# Patient Record
Sex: Female | Born: 2005 | Hispanic: Yes | Marital: Single | Smoking: Never smoker
Health system: Southern US, Community
[De-identification: ages and names within clinical notes are randomized; demographics above are authoritative.]

## PROBLEM LIST (undated history)

## (undated) DIAGNOSIS — J45909 Unspecified asthma, uncomplicated: Secondary | ICD-10-CM

---
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
## 2023-04-18 ENCOUNTER — Encounter (INDEPENDENT_AMBULATORY_CARE_PROVIDER_SITE_OTHER): Payer: Self-pay

## 2023-08-04 ENCOUNTER — Encounter (INDEPENDENT_AMBULATORY_CARE_PROVIDER_SITE_OTHER): Payer: Self-pay | Admitting: Pediatric Gastroenterology

## 2023-12-22 ENCOUNTER — Other Ambulatory Visit (INDEPENDENT_AMBULATORY_CARE_PROVIDER_SITE_OTHER): Payer: Self-pay | Admitting: Neurology

## 2023-12-23 ENCOUNTER — Telehealth (INDEPENDENT_AMBULATORY_CARE_PROVIDER_SITE_OTHER): Payer: Self-pay | Admitting: Pediatric Endocrinology

## 2023-12-25 ENCOUNTER — Encounter (INDEPENDENT_AMBULATORY_CARE_PROVIDER_SITE_OTHER): Payer: Self-pay | Admitting: Neurology

## 2023-12-25 ENCOUNTER — Other Ambulatory Visit (INDEPENDENT_AMBULATORY_CARE_PROVIDER_SITE_OTHER): Payer: Self-pay | Admitting: Neurology

## 2023-12-25 ENCOUNTER — Ambulatory Visit: Payer: MEDICAID | Admitting: Nurse Practitioner

## 2023-12-25 ENCOUNTER — Ambulatory Visit (INDEPENDENT_AMBULATORY_CARE_PROVIDER_SITE_OTHER): Payer: MEDICAID | Admitting: Neurology

## 2023-12-25 ENCOUNTER — Telehealth: Payer: Self-pay | Admitting: Family Medicine

## 2023-12-25 VITALS — BP 124/60 | HR 62 | Ht 65.2 in | Wt 271.6 lb

## 2023-12-25 VITALS — BP 117/75 | HR 74 | Temp 98.2°F | Ht 65.52 in | Wt 272.6 lb

## 2023-12-25 DIAGNOSIS — E161 Other hypoglycemia: Secondary | ICD-10-CM | POA: Diagnosis not present

## 2023-12-25 DIAGNOSIS — L83 Acanthosis nigricans: Secondary | ICD-10-CM

## 2023-12-25 DIAGNOSIS — E559 Vitamin D deficiency, unspecified: Secondary | ICD-10-CM

## 2023-12-25 DIAGNOSIS — F603 Borderline personality disorder: Secondary | ICD-10-CM

## 2023-12-25 DIAGNOSIS — G4700 Insomnia, unspecified: Secondary | ICD-10-CM

## 2023-12-25 DIAGNOSIS — R519 Headache, unspecified: Secondary | ICD-10-CM

## 2023-12-25 DIAGNOSIS — F411 Generalized anxiety disorder: Secondary | ICD-10-CM | POA: Diagnosis not present

## 2023-12-25 DIAGNOSIS — R5383 Other fatigue: Secondary | ICD-10-CM

## 2023-12-25 DIAGNOSIS — E282 Polycystic ovarian syndrome: Secondary | ICD-10-CM

## 2023-12-25 MED ORDER — HYDROXYZINE PAMOATE 25 MG PO CAPS: ORAL_CAPSULE | ORAL | 0 refills | Status: DC

## 2023-12-25 MED ORDER — TOPIRAMATE 100 MG PO TABS: 100.0000 mg | ORAL_TABLET | Freq: Two times a day (BID) | ORAL | 5 refills | Status: DC

## 2023-12-25 NOTE — Progress Notes (Signed)
 Patient: Martha [name] MRN: [id number] Sex: female DOB: 01-22-2006  Provider: Keturah Shavers, MD Location of Care: [facility]  Note type: Routine return visit  Referral Source: [facility], Alvino Chapel, FNP History from: patient, [facility] chart, and mom Chief Complaint: Headaches   History of Present Illness: Martha [name] is a 18 y.o. female is here for follow-up management of headache. She has been having episodes of migraine and tension type headaches over the past few years with normal brain MRI/MRV and normal ophthalmology exam. She has been on Topamax with low to moderate dose of 50 mg twice daily with fairly good headache control until a couple of months ago when she started having more frequent headaches and over the past 1 months she had at least 15-20 headaches and needed to take OTC medications at least 15 days. The headaches are usually moderate and may last for few hours and occasionally she may have sensitivity to light but she does not have any nausea or vomiting with the headaches. She is also having some difficulty sleeping at night and she has history of anxiety and depression for which she was seen by psychologist but she has not been seen by behavioral service recently since she said that she is doing better. She was also having some vitamin D deficiency and she was taking vitamin D supplements at some point but not right now. Overall she thinks that she is having significantly more headaches over the past 1 to 2 months although she is taking the Topamax regularly without any missing doses. She may have history of polycystic ovary disease  Review of Systems: Review of system as per HPI, otherwise negative.  Past Medical History:  Diagnosis Date   Asthma    Hypothyroid 09/17/2021   Hospitalizations: No., Head Injury: No., Nervous System Infections: No., Immunizations up to date: Yes.     Surgical History History reviewed. No pertinent surgical  history.  Family History family history includes Asthma in an other family member; Cancer in an other family member; Diabetes in her maternal grandmother and paternal grandfather; Diabetes type II in her mother; Heart disease in an other family member; Hyperlipidemia in an other family member; Hypertension in an other family member; Stroke in an other family member; Thyroid disease in her paternal aunt.   Social History Social History   Socioeconomic History   Marital status: Single    Spouse name: Not on file   Number of children: Not on file   Years of education: Not on file   Highest education level: Not on file  Occupational History   Not on file  Tobacco Use   Smoking status: Never    Passive exposure: Yes   Smokeless tobacco: Never   Tobacco comments:    dad smokes outside  Vaping Use   Vaping status: Never Used  Substance and Sexual Activity   Alcohol use: Never   Drug use: Never   Sexual activity: Never    Birth control/protection: Abstinence  Other Topics Concern   Not on file  Social History Narrative   12th grade [employer]    Lives with mom, dad, and inside dog Diva. Dogs and a cat outdoors   Social Drivers of Corporate investment banker Strain: Not on file  Food Insecurity: Not on file  Transportation Needs: Not on file  Physical Activity: Not on file  Stress: Not on file  Social Connections: Not on file     Allergies  Allergen Reactions   Beef-Derived Drug Products     Alpha Gal positive- (Beef, Pork and Lamb)    Physical Exam BP 124/60   Pulse 62   Ht 5' 5.2" (1.656 m)   Wt 271 lb 9.7 oz (123.2 kg)   BMI 44.93 kg/m  Gen: Awake, alert, not in distress Skin: No rash, No neurocutaneous stigmata. HEENT: Normocephalic, no dysmorphic features, no conjunctival injection, nares patent, mucous membranes moist, oropharynx clear. Neck: Supple, no meningismus. No focal tenderness. Resp: Clear to auscultation bilaterally CV: Regular rate,  normal S1/S2, no murmurs, no rubs Abd: BS present, abdomen soft, non-tender, non-distended. No hepatosplenomegaly or mass Ext: Warm and well-perfused. No deformities, no muscle wasting, ROM full.  Neurological Examination: MS: Awake, alert, interactive. Normal eye contact, answered the questions appropriately, speech was fluent,  Normal comprehension.  Attention and concentration were normal. Cranial Nerves: Pupils were equal and reactive to light ( 5-57mm);  normal fundoscopic exam with sharp discs, visual field full with confrontation test; EOM normal, no nystagmus; no ptsosis, no double vision, intact facial sensation, face symmetric with full strength of facial muscles, hearing intact to finger rub bilaterally, palate elevation is symmetric, tongue protrusion is symmetric with full movement to both sides.  Sternocleidomastoid and trapezius are with normal strength. Tone-Normal Strength-Normal strength in all muscle groups DTRs-  Biceps Triceps Brachioradialis Patellar Ankle  R 2+ 2+ 2+ 2+ 2+  L 2+ 2+ 2+ 2+ 2+   Plantar responses flexor bilaterally, no clonus noted Sensation: Intact to light touch, temperature, vibration, Romberg negative. Coordination: No dysmetria on FTN test. No difficulty with balance. Gait: Normal walk and run. Tandem gait was normal. Was able to perform toe walking and heel walking without difficulty.   Assessment and Plan 1. Frequent headaches   2. Vitamin D deficiency   3. Anxiety state    This is a 18 year old female with episodes of migraine and tension type headaches.  Some anxiety issues and history of vitamin D deficiency, currently on low to moderate dose of Topamax with some help although she started having more frequent headaches over the past couple of months.  She has no focal findings on her neurological examination. Based on her weight she is on fairly low-dose Topamax and since she is having frequent headaches, I would recommend to increase the dose  of Topamax to 100 mg twice daily and see how she does.  Hopefully as soon as she is doing better in terms of headache intensity and frequency, we will decrease the dose of Topamax. She needs to have more hydration with adequate sleep and limited screen time She needs to follow-up with behavioral service again to help with some of the anxiety issues that she may have She may take occasional Tylenol or ibuprofen for moderate to severe headache but no more than 2 times a week She needs to discuss with her PCP if she needs to restart taking vitamin D supplements or check vitamin D level and then decide regarding supplements She will make a headache diary and bring it on her next visit I would like to see her in 5 months for follow-up visit and based on her headache diary may adjust the dose of medication or switch to another medication.  Meds ordered this encounter  Medications   topiramate (TOPAMAX) 100 MG tablet    Sig: Take 1 tablet (100 mg total) by mouth 2 (two) times daily.    Dispense:  60 tablet    Refill:  5  No orders of the defined types were placed in this encounter.

## 2023-12-25 NOTE — Telephone Encounter (Signed)
 Copied from CRM [id number]. Topic: Clinical - Medication Question >> Dec 25, 2023  4:08 PM [place] B wrote: Reason for CRM: pt's mother called in states she thought Dr Adriana Simas was going to prescribe pt something for sleep. I don't show anything about this. Please cb

## 2023-12-25 NOTE — Patient Instructions (Signed)
 Since the headaches are frequent, I would recommend to increase the dose of Topamax to 100 mg tablet twice daily Continue with more hydration, adequate sleep and limited screen time Follow-up with behavioral service for anxiety issues Have regular exercise on a daily basis May take occasional Tylenol or ibuprofen for moderate to severe headache but no more than 2 times a week Return in 5 months for follow-up visit

## 2023-12-25 NOTE — Progress Notes (Unsigned)
 The patient comes in today for a wellness visit.    A review of their health history was completed.  A review of medications was also completed.  Any needed refills; Inhaler  Eating habits: Normal  Falls/  MVA accidents in past few months: No  Regular exercise: Walk and working out occasionally   Specialist pt sees on regular basis: Neurologist   Preventative health issues were discussed.   Additional concerns: Would like blood work

## 2023-12-25 NOTE — Telephone Encounter (Signed)
 Order verified in visit. Sent to [facility].

## 2023-12-25 NOTE — Progress Notes (Unsigned)
 Subjective:    Patient ID: Martha [name], female    DOB: 03/14/2006, 18 y.o.   MRN: [id number]  HPI Presents today after visiting her neurologist this morning for headaches, and is requesting lab work. Has been having migraines, and complains of insomnia. Reports that she stayed up for four days straight last week, which is a new occurrence. She only stays up for 24 hours at most when having trouble sleeping. Denies racing thoughts, fatigue, or increased energy when she is unable to sleep. Has been Vitamin D deficient in the past, and took supplements. Denies taking them recently. Denies any caffeine intake. Has tried Melatonin for sleep, but says it does not work consistently. Has followed with psychiatry in the past, and was diagnosed with anxiety, depression and BPD. Reports that they put her on multiple medications that did not improve symptoms, and she went to therapy for 2 years and had better results with that, so she stopped seeing psychiatry. Denies any stress or feelings of anxiety. 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 No relationship or family issues noted.    Review of Systems  Constitutional:  Negative for activity change, appetite change and fatigue.  Respiratory:  Negative for choking, chest tightness, shortness of breath and wheezing.   Cardiovascular:  Negative for chest pain.  Neurological:  Positive for headaches.  Psychiatric/Behavioral:  Positive for sleep disturbance. Negative for agitation. The patient is not nervous/anxious and is not hyperactive.       Objective:   Physical Exam Vitals and nursing note reviewed.  Cardiovascular:     Rate and Rhythm: Normal rate and regular rhythm.     Heart sounds: Normal heart sounds, S1 normal and S2 normal. No murmur heard. Pulmonary:     Effort: Pulmonary effort is normal. No respiratory distress.     Breath sounds: Normal breath sounds. No wheezing.  Psychiatric:        Attention and  Perception: Attention normal.        Mood and Affect: Mood normal.        Speech: Speech normal.        Behavior: Behavior is not withdrawn.        Thought Content: Thought content normal.        Cognition and Memory: Cognition normal.        Judgment: Judgment normal.    Vitals:   12/25/23 1507  BP: 117/75  Pulse: 74  Temp: 98.2 F (36.8 C)  Height: 5' 5.52" (1.664 m)  Weight: 272 lb 9.6 oz (123.7 kg)  SpO2: 99%  TempSrc: Temporal  BMI (Calculated): 44.66       12/25/2023    3:09 PM 07/09/2022    4:07 PM 10/04/2021    1:56 PM 09/05/2021    4:11 PM 09/05/2021    2:54 PM  Depression screen PHQ 2/9  Decreased Interest 0 0 0 0 0  Down, Depressed, Hopeless 0 0 0 0 0  PHQ - 2 Score 0 0 0 0 0  Altered sleeping 3  1 0   Tired, decreased energy 0  1 3   Change in appetite 0  1 3   Feeling bad or failure about yourself  0  0 0   Trouble concentrating 0  0 0   Moving slowly or fidgety/restless 0  0 0   Suicidal thoughts 0      PHQ-9 Score 3  3 6    Difficult doing work/chores  Somewhat difficult           12/25/2023    3:09 PM 10/04/2021    1:55 PM 09/05/2021    4:12 PM 04/12/2021    3:20 PM  GAD 7 : Generalized Anxiety Score  Nervous, Anxious, on Edge 0 0 0 0  Control/stop worrying 0 0 0 1  Worry too much - different things 0 0 0 1  Trouble relaxing 0 0 0 0  Restless 0 1 0 0  Easily annoyed or irritable 0 1 0 1  Afraid - awful might happen 0 0 0 0  Total GAD 7 Score 0 2 0 3  Anxiety Difficulty  Not difficult at all Not difficult at all Not difficult at all      Assessment & Plan:  1. Hyperinsulinemia - Comprehensive metabolic panel - Hemoglobin A1c - Lipid panel  2. Acanthosis nigricans - Comprehensive metabolic panel - Hemoglobin A1c - Lipid panel  3. Morbid obesity (HCC) - Comprehensive metabolic panel - Hemoglobin A1c - Lipid panel  4. Vitamin D deficiency - VITAMIN D 25 Hydroxy (Vit-D Deficiency, Fractures)  5. PCOS (polycystic ovarian syndrome) -  Comprehensive metabolic panel - Hemoglobin A1c - Lipid panel  6. Fatigue, unspecified type - CBC with Differential/Platelet - Comprehensive metabolic panel  7. Insomnia, unspecified type (Primary)  8. Borderline personality disorder in adult [facility]) - Ambulatory referral to Psychiatry - Ambulatory referral to Psychology   Has been seen by both pediatric neurology and pediatric endocrinology within the last year. Talked with patient about options, and she agreed that she would like to get established with an adult psychiatrist. With the incidence of lack of sleep for four days and that she has had a previous diagnosis of borderline personality disorder in the past, I would like for her to be evaluated by psychiatry. Ordered medication to help with insomnia. Will order labs that have not been ordered in the last year.   Meds ordered this encounter  Medications   hydrOXYzine (VISTARIL) 25 MG capsule    Sig: Take one cap po at bedtime prn sleep.    Dispense:  30 capsule    Refill:  0    Supervising Provider:   Lilyan Punt A [[id number]]    Return if symptoms worsen or fail to improve.

## 2023-12-26 ENCOUNTER — Other Ambulatory Visit: Payer: Self-pay | Admitting: Nurse Practitioner

## 2023-12-26 ENCOUNTER — Encounter: Payer: Self-pay | Admitting: Nurse Practitioner

## 2023-12-26 LAB — COMPREHENSIVE METABOLIC PANEL
ALT: 14 IU/L (ref 0–32)
AST: 13 IU/L (ref 0–40)
Albumin: 4.5 g/dL (ref 4.0–5.0)
Alkaline Phosphatase: 95 IU/L (ref 42–106)
BUN/Creatinine Ratio: 18 (ref 9–23)
BUN: 15 mg/dL (ref 6–20)
Bilirubin Total: 0.2 mg/dL (ref 0.0–1.2)
CO2: 22 mmol/L (ref 20–29)
Calcium: 9.5 mg/dL (ref 8.7–10.2)
Chloride: 106 mmol/L (ref 96–106)
Creatinine, Ser: 0.84 mg/dL (ref 0.57–1.00)
Globulin, Total: 2.6 g/dL (ref 1.5–4.5)
Glucose: 85 mg/dL (ref 70–99)
Potassium: 4.5 mmol/L (ref 3.5–5.2)
Sodium: 142 mmol/L (ref 134–144)
Total Protein: 7.1 g/dL (ref 6.0–8.5)
eGFR: 103 mL/min/{1.73_m2} (ref >59)

## 2023-12-26 LAB — CBC WITH DIFFERENTIAL/PLATELET
Basophils Absolute: 0.1 10*3/uL (ref 0.0–0.2)
Basos: 1 %
EOS (ABSOLUTE): 0.2 10*3/uL (ref 0.0–0.4)
Eos: 2 %
Hematocrit: 40.6 % (ref 34.0–46.6)
Hemoglobin: 13.3 g/dL (ref 11.1–15.9)
Immature Grans (Abs): 0 10*3/uL (ref 0.0–0.1)
Immature Granulocytes: 0 %
Lymphocytes Absolute: 4.3 10*3/uL — ABNORMAL HIGH (ref 0.7–3.1)
Lymphs: 39 %
MCH: 28.7 pg (ref 26.6–33.0)
MCHC: 32.8 g/dL (ref 31.5–35.7)
MCV: 88 fL (ref 79–97)
Monocytes Absolute: 0.7 10*3/uL (ref 0.1–0.9)
Monocytes: 6 %
Neutrophils Absolute: 5.7 10*3/uL (ref 1.4–7.0)
Neutrophils: 52 %
Platelets: 302 10*3/uL (ref 150–450)
RBC: 4.64 x10E6/uL (ref 3.77–5.28)
RDW: 13.2 % (ref 11.7–15.4)
WBC: 11.1 10*3/uL — ABNORMAL HIGH (ref 3.4–10.8)

## 2023-12-26 LAB — LIPID PANEL
Chol/HDL Ratio: 2.6 ratio (ref 0.0–4.4)
Cholesterol, Total: 141 mg/dL (ref 100–169)
HDL: 55 mg/dL (ref >39)
LDL Chol Calc (NIH): 71 mg/dL (ref 0–109)
Triglycerides: 79 mg/dL (ref 0–89)
VLDL Cholesterol Cal: 15 mg/dL (ref 5–40)

## 2023-12-26 LAB — VITAMIN D 25 HYDROXY (VIT D DEFICIENCY, FRACTURES): Vit D, 25-Hydroxy: 13.4 ng/mL — ABNORMAL LOW (ref 30.0–100.0)

## 2023-12-26 LAB — HEMOGLOBIN A1C
Est. average glucose Bld gHb Est-mCnc: 103 mg/dL
Hgb A1c MFr Bld: 5.2 % (ref 4.8–5.6)

## 2023-12-26 MED ORDER — VITAMIN D (ERGOCALCIFEROL) 1.25 MG (50000 UNIT) PO CAPS: 50000.0000 [IU] | ORAL_CAPSULE | ORAL | 0 refills | Status: DC

## 2024-01-12 ENCOUNTER — Ambulatory Visit (INDEPENDENT_AMBULATORY_CARE_PROVIDER_SITE_OTHER): Payer: MEDICAID | Admitting: Nurse Practitioner

## 2024-01-12 ENCOUNTER — Encounter: Payer: Self-pay | Admitting: Nurse Practitioner

## 2024-01-12 VITALS — BP 104/75 | HR 75 | Temp 98.4°F | Ht 65.52 in | Wt 269.0 lb

## 2024-01-12 DIAGNOSIS — N939 Abnormal uterine and vaginal bleeding, unspecified: Secondary | ICD-10-CM

## 2024-01-12 DIAGNOSIS — N946 Dysmenorrhea, unspecified: Secondary | ICD-10-CM | POA: Insufficient documentation

## 2024-01-12 DIAGNOSIS — N921 Excessive and frequent menstruation with irregular cycle: Secondary | ICD-10-CM | POA: Diagnosis not present

## 2024-01-12 MED ORDER — DROSPIRENONE-ETHINYL ESTRADIOL 3-0.02 MG PO TABS: 1.0000 | ORAL_TABLET | Freq: Every day | ORAL | 0 refills | Status: DC

## 2024-01-12 MED ORDER — ALBUTEROL SULFATE HFA 108 (90 BASE) MCG/ACT IN AERS: 2.0000 | INHALATION_SPRAY | Freq: Four times a day (QID) | RESPIRATORY_TRACT | 0 refills | Status: DC | PRN

## 2024-01-12 NOTE — Progress Notes (Signed)
   Subjective:    Patient ID: Martha [name], female    DOB: [birth year], 18 y.o.   MRN: [id number]  HPI Irregular periods and heavy bleeding lasting 7 days Heavy bleeding 6 of 7 days using 5 to 6 pads during the day  And getting up 3 times a night for bleeding  Having significant cramping; for cramps takes tylenol and motrin  Lightheadedness during these cycles Has been on Yaz in the past which she tolerated well. Denies any history of sexual activity.  No her mother is present during the visit per patient request, defers being interviewed alone. PMH includes menorrhagia with irregular cycle and PCOS.  Review of Systems  Respiratory:  Negative for cough, chest tightness, shortness of breath and wheezing.   Genitourinary:  Positive for menstrual problem, pelvic pain and vaginal bleeding.   Social History   Tobacco Use   Smoking status: Never    Passive exposure: Yes   Smokeless tobacco: Never   Tobacco comments:    dad smokes outside  Vaping Use   Vaping status: Never Used  Substance Use Topics   Alcohol use: Never   Drug use: Never        Objective:   Physical Exam NAD.  Alert, oriented.  Calm cheerful affect.  Lungs clear.  Heart regular rate rhythm. Today's Vitals   01/12/24 1438  BP: 104/75  Pulse: 75  Temp: 98.4 F (36.9 C)  SpO2: 99%  Weight: 269 lb (122 kg)  Height: 5' 5.52" (1.664 m)   Body mass index is 44.06 kg/m.  12/25/2023 CBC hemoglobin 13.3.      Assessment & Plan:   Problem List Items Addressed This Visit       Genitourinary   Abnormal uterine bleeding - Primary   Dysmenorrhea     Other   Menorrhagia with irregular cycle   Meds ordered this encounter  Medications   drospirenone-ethinyl estradiol (YAZ) 3-0.02 MG tablet    Sig: Take 1 tablet by mouth daily.    Dispense:  84 tablet    Refill:  0    Supervising Provider:   Lilyan Punt A [[id number]]   albuterol (VENTOLIN HFA) 108 (90 Base) MCG/ACT inhaler    Sig: Inhale 2 puffs into  the lungs every 6 (six) hours as needed for wheezing or shortness of breath.    Dispense:  8 g    Refill:  0    Supervising Provider:   Lilyan Punt A [[id number]]   Restart Yaz as directed.  Our goal is to get shorter regular cycles with lighter flow and less cramping.  Patient to call back if no improvement over the next few weeks. At the end of the visit patient also requested refill on her albuterol inhaler to have on hand during the spring season just in case she needs it.  Advised patient if she is using it more than 3 times per week to restart her Flovent inhaler as well.  Verbalizes understanding.

## 2024-04-27 ENCOUNTER — Other Ambulatory Visit: Payer: Self-pay | Admitting: Nurse Practitioner

## 2024-05-26 ENCOUNTER — Encounter (INDEPENDENT_AMBULATORY_CARE_PROVIDER_SITE_OTHER): Payer: Self-pay | Admitting: Neurology

## 2024-05-26 ENCOUNTER — Ambulatory Visit (INDEPENDENT_AMBULATORY_CARE_PROVIDER_SITE_OTHER): Payer: MEDICAID | Admitting: Neurology

## 2024-05-26 VITALS — BP 126/72 | HR 74 | Ht 64.8 in | Wt 254.9 lb

## 2024-05-26 DIAGNOSIS — F411 Generalized anxiety disorder: Secondary | ICD-10-CM

## 2024-05-26 DIAGNOSIS — R519 Headache, unspecified: Secondary | ICD-10-CM | POA: Diagnosis not present

## 2024-05-26 DIAGNOSIS — E559 Vitamin D deficiency, unspecified: Secondary | ICD-10-CM

## 2024-05-26 MED ORDER — TOPIRAMATE 100 MG PO TABS: 100.0000 mg | ORAL_TABLET | Freq: Two times a day (BID) | ORAL | 7 refills | Status: AC

## 2024-05-26 NOTE — Progress Notes (Signed)
 Patient: Martha [name] MRN: [id number] Sex: female DOB: 05/09/2006  Provider: Norwood Abu, MD Location of Care: [facility]  Note type: Routine return visit  Referral Source: Bluford Jacqulyn MATSU, DO History from: patient, [facility] chart, and Mom Chief Complaint: Headaches   History of Present Illness: Martha [name] is a 18 y.o. female is here for follow-up management of headache. She has been having episodes of migraine and tension type headaches for the past few years with normal brain MRI/MRV as well as normal ophthalmology exam She was started on Topamax  for headache with some help but on her last visit in March since she was still having frequent headaches, the dose of medication increased to Topamax  100 mg twice daily which she has been taking over the past few months.  She has not had any side effects of medication. As per patient and her mother, she has had significant improvement of the headaches and over the past month she had just 1 headache needed OTC medications.  She usually sleeps well without any difficulty and with no awakening headaches.  She has no behavioral or mood changes.  Overall she is happy with her progress and has no other complaints or concerns at this time.  Review of Systems: Review of system as per HPI, otherwise negative.  Past Medical History:  Diagnosis Date   Asthma    Hypothyroid 09/17/2021   Hospitalizations: No., Head Injury: No., Nervous System Infections: No., Immunizations up to date: Yes.     Surgical History History reviewed. No pertinent surgical history.  Family History family history includes Asthma in an other family member; Cancer in an other family member; Diabetes in her maternal grandmother and paternal grandfather; Diabetes type II in her mother; Heart disease in an other family member; Hyperlipidemia in an other family member; Hypertension in an other family member; Stroke in an other family member; Thyroid  disease in  her paternal aunt.   Social History Social History   Socioeconomic History   Marital status: Single    Spouse name: Not on file   Number of children: Not on file   Years of education: Not on file   Highest education level: Not on file  Occupational History   Not on file  Tobacco Use   Smoking status: Never    Passive exposure: Yes   Smokeless tobacco: Never   Tobacco comments:    dad smokes outside  Vaping Use   Vaping status: Never Used  Substance and Sexual Activity   Alcohol use: Never   Drug use: Never   Sexual activity: Never    Birth control/protection: Abstinence  Other Topics Concern   Not on file  Social History Narrative   12th grade [employer]    Lives with mom, dad, and inside dog Diva. Dogs and a cat outdoors   Social Drivers of Corporate investment banker Strain: Not on file  Food Insecurity: Not on file  Transportation Needs: Not on file  Physical Activity: Not on file  Stress: Not on file  Social Connections: Not on file     Allergies  Allergen Reactions   Beef-Derived Drug Products     Alpha Gal positive- (Beef, Pork and Lamb)    Physical Exam BP 126/72   Pulse 74   Ht 5' 4.8 (1.646 m)   Wt 254 lb 13.6 oz (115.6 kg)   LMP 05/11/2024 (Exact Date)   BMI 42.67 kg/m  Gen: Awake, alert, not in distress  Skin: No rash, No neurocutaneous stigmata. HEENT: Normocephalic, no dysmorphic features, no conjunctival injection, nares patent, mucous membranes moist, oropharynx clear. Neck: Supple, no meningismus. No focal tenderness. Resp: Clear to auscultation bilaterally CV: Regular rate, normal S1/S2, no murmurs, no rubs Abd: BS present, abdomen soft, non-tender, non-distended. No hepatosplenomegaly or mass Ext: Warm and well-perfused. No deformities, no muscle wasting, ROM full.  Neurological Examination: MS: Awake, alert, interactive. Normal eye contact, answered the questions appropriately, speech was fluent,  Normal comprehension.   Attention and concentration were normal. Cranial Nerves: Pupils were equal and reactive to light ( 5-34mm);  normal fundoscopic exam with sharp discs, visual field full with confrontation test; EOM normal, no nystagmus; no ptsosis, no double vision, intact facial sensation, face symmetric with full strength of facial muscles, hearing intact to finger rub bilaterally, palate elevation is symmetric, tongue protrusion is symmetric with full movement to both sides.  Sternocleidomastoid and trapezius are with normal strength. Tone-Normal Strength-Normal strength in all muscle groups DTRs-  Biceps Triceps Brachioradialis Patellar Ankle  R 2+ 2+ 2+ 2+ 2+  L 2+ 2+ 2+ 2+ 2+   Plantar responses flexor bilaterally, no clonus noted Sensation: Intact to light touch, temperature, vibration, Romberg negative. Coordination: No dysmetria on FTN test. No difficulty with balance. Gait: Normal walk and run. Tandem gait was normal. Was able to perform toe walking and heel walking without difficulty.   Assessment and Plan 1. Frequent headaches   2. Vitamin D  deficiency   3. Anxiety state     This is a 18 year old female with episodes of migraine and tension type headaches, started on Topamax  with significant improvement of her headaches after increasing the dose of medication to the current dose of 100 mg twice daily.  She has no focal findings on her neurological examination and tolerating medication well with no side effects. Since she is not having any headaches over the past couple of months, I would recommend to slightly decrease the morning dose of Topamax  to 50 mg or half a tablet every morning and continue with the same 100 mg Topamax  every night and see how she does.  If she develops more frequent headaches then she will go back to the same previous dose of 100 mg twice daily Topamax . She will continue with more hydration, adequate sleep and limited screen time She may take occasional Tylenol  or ibuprofen   for moderate to severe headache If she develops more frequent headaches, mother will call my office and let me know She will continue making headache diary and bring it on her next visit I would like to see her in 6 months for follow-up visit to adjust the dose of medication if needed.  She and her mother understood and agreed with the plan.   Meds ordered this encounter  Medications   topiramate  (TOPAMAX ) 100 MG tablet    Sig: Take 1 tablet (100 mg total) by mouth 2 (two) times daily.    Dispense:  60 tablet    Refill:  7   No orders of the defined types were placed in this encounter.

## 2024-05-26 NOTE — Patient Instructions (Addendum)
 Since the headaches are significantly better, I would recommend to start taking slightly less medicine in the morning so he will take 50 mg or half a tablet in a.m. and 100 mg in p.m. If you start having more frequent headaches then you may go back to the previous dose of Topamax  which is 100 mg twice daily. Continue with more hydration, adequate sleep and limited screen time May take occasional Tylenol  or ibuprofen  for moderate to severe headache Return in 6 months for follow-up visit

## 2024-06-25 ENCOUNTER — Encounter: Payer: Self-pay | Admitting: Nurse Practitioner

## 2024-06-25 ENCOUNTER — Ambulatory Visit (INDEPENDENT_AMBULATORY_CARE_PROVIDER_SITE_OTHER): Payer: MEDICAID | Admitting: Nurse Practitioner

## 2024-06-25 VITALS — BP 112/75 | HR 69 | Temp 99.5°F | Ht 64.81 in | Wt 250.1 lb

## 2024-06-25 DIAGNOSIS — F419 Anxiety disorder, unspecified: Secondary | ICD-10-CM

## 2024-06-25 DIAGNOSIS — J4520 Mild intermittent asthma, uncomplicated: Secondary | ICD-10-CM | POA: Diagnosis not present

## 2024-06-25 DIAGNOSIS — Z23 Encounter for immunization: Secondary | ICD-10-CM

## 2024-06-25 DIAGNOSIS — G479 Sleep disorder, unspecified: Secondary | ICD-10-CM

## 2024-06-25 MED ORDER — HYDROXYZINE PAMOATE 25 MG PO CAPS: ORAL_CAPSULE | ORAL | 1 refills | Status: AC

## 2024-06-25 MED ORDER — ALBUTEROL SULFATE HFA 108 (90 BASE) MCG/ACT IN AERS: 2.0000 | INHALATION_SPRAY | Freq: Four times a day (QID) | RESPIRATORY_TRACT | 0 refills | Status: AC | PRN

## 2024-06-27 ENCOUNTER — Encounter: Payer: Self-pay | Admitting: Nurse Practitioner

## 2024-06-27 DIAGNOSIS — F419 Anxiety disorder, unspecified: Secondary | ICD-10-CM | POA: Insufficient documentation

## 2024-06-27 DIAGNOSIS — J4520 Mild intermittent asthma, uncomplicated: Secondary | ICD-10-CM | POA: Insufficient documentation

## 2024-06-27 NOTE — Progress Notes (Signed)
 Subjective:    Patient ID: Martha [name], female    DOB: [birth year], 18 y.o.   MRN: [id number]  HPI Discussed the use of AI scribe software for clinical note transcription with the patient, who gave verbal consent to proceed.  History of Present Illness Martha [name] is a 18 year old female who presents with insomnia and nightmares.  She experiences difficulty falling asleep and staying asleep, accompanied by nightmares. Upon waking, she has shortness of breath and sweating, which further disrupts her ability to return to sleep. This pattern has been ongoing since her last visit, with some nights resulting in no sleep at all. Previously, she was prescribed hydroxyzine , which improved her sleep for a couple of months. She discontinued it after the prescription ran out and managed well until recently. During the period she took hydroxyzine , she was able to sleep well through the night.  She has a history of frequent headaches for which she is under the care of a neurologist. She is currently taking topiramate  as prescribed by her neurologist.  She is actively managing her weight and has successfully lost 22 pounds, reducing from 270 to 250 pounds. She maintains an active lifestyle and reports a good appetite and diet.  She has a history of abnormal uterine bleeding, which has been managed with daily medication, resulting in improvement.  She uses albuterol  approximately twice a week, particularly with increased physical activity, and is also on a daily regimen of Flovent , two puffs in the morning and two at night. She reports occasional wheezing with activity but no chest pain or significant shortness of breath.  She wants to see a mental health therapist for anxiety management. She is currently in school. Denies suicidal or homicidal thoughts or ideation. Denies self harm behaviors .  Review of Systems  HENT:  Negative for sore throat and trouble swallowing.   Respiratory:  Negative  for cough, chest tightness, shortness of breath and wheezing.        Occasional mild wheezing with intense activity. Uses albuterol  rarely, around twice a week.   Cardiovascular:  Negative for chest pain.  Psychiatric/Behavioral:  Positive for sleep disturbance. Negative for self-injury and suicidal ideas. The patient is nervous/anxious.       06/25/2024    3:18 PM  Depression screen PHQ 2/9  Decreased Interest 0  Down, Depressed, Hopeless 0  PHQ - 2 Score 0  Altered sleeping 0  Tired, decreased energy 3  Change in appetite 0  Feeling bad or failure about yourself  0  Trouble concentrating 0  Moving slowly or fidgety/restless 0  Suicidal thoughts 0  PHQ-9 Score 3  Difficult doing work/chores Very difficult      06/25/2024    3:19 PM 01/12/2024    2:44 PM 12/25/2023    3:09 PM 10/04/2021    1:55 PM  GAD 7 : Generalized Anxiety Score  Nervous, Anxious, on Edge 2 0 0 0  Control/stop worrying 2 0 0 0  Worry too much - different things 2 0 0 0  Trouble relaxing 2 0 0 0  Restless 0 0 0 1  Easily annoyed or irritable 2 0 0 1  Afraid - awful might happen 0 0 0 0  Total GAD 7 Score 10 0 0 2  Anxiety Difficulty Somewhat difficult Not difficult at all  Not difficult at all        Objective:   Physical Exam Constitutional:      General: She is not  in acute distress. Neck:     Comments: Thyroid  non tender. No mass or goiter noted.  Cardiovascular:     Rate and Rhythm: Normal rate and regular rhythm.     Heart sounds: Normal heart sounds.  Pulmonary:     Effort: Pulmonary effort is normal.     Breath sounds: Normal breath sounds.  Musculoskeletal:     Cervical back: Neck supple.  Neurological:     Mental Status: She is alert and oriented to person, place, and time.  Psychiatric:        Mood and Affect: Mood normal.        Behavior: Behavior normal.        Thought Content: Thought content normal.        Judgment: Judgment normal.     Comments: Making good eye contact.  Speech clear. Calm, cheerful affect.     Today's Vitals   06/25/24 1513  BP: 112/75  Pulse: 69  Temp: 99.5 F (37.5 C)  SpO2: 99%  Weight: 250 lb 1 oz (113.4 kg)  Height: 5' 4.81 (1.646 m)  PainSc: 0-No pain   Body mass index is 41.86 kg/m.        Assessment & Plan:  1. Sleep disturbance (Primary) Chronic insomnia with nightmares, shortness of breath, and sweating. Hydroxyzine  previously effective. Symptoms recurred post-discontinuation. Hydroxyzine  preferred; trazodone considered if ineffective. - Prescribe hydroxyzine  25 mg for insomnia. - Discuss trazodone if hydroxyzine  ineffective after two weeks. - Set up MyChart account for communication and health management. - hydrOXYzine  (VISTARIL ) 25 MG capsule; Take one cap po at bedtime prn sleep.  Dispense: 90 capsule; Refill: 1  2. Need for vaccination  - Flu Vaccine QUAD 48mo+IM (Fluarix, Fluzone & Alfiuria Quad PF)  3. Anxiety Anxiety symptoms present. Therapy preferred initially; medication considered if therapy insufficient. - Provide referral to mental health therapist accepting Medicaid. - Discuss medication if therapy insufficient, based on therapist's recommendation. - hydrOXYzine  (VISTARIL ) 25 MG capsule; Take one cap po at bedtime prn sleep.  Dispense: 90 capsule; Refill: 1  4. Asthma Asthma well-controlled with current inhalers. Occasional exercise-induced wheezing and tightness. Consider Airsupra if endurance does not improve or albuterol  use increases. - Continue Flovent : two puffs morning and night. - Refill albuterol  inhaler for use as needed, up to twice a week. - Discuss Airsupra if endurance does not improve or albuterol  use increases.  Return if symptoms worsen or fail to improve.

## 2024-07-14 ENCOUNTER — Encounter (INDEPENDENT_AMBULATORY_CARE_PROVIDER_SITE_OTHER): Payer: Self-pay

## 2024-07-15 ENCOUNTER — Encounter (INDEPENDENT_AMBULATORY_CARE_PROVIDER_SITE_OTHER): Payer: Self-pay

## 2024-09-19 ENCOUNTER — Emergency Department (HOSPITAL_COMMUNITY)
Admission: EM | Admit: 2024-09-19 | Discharge: 2024-09-20 | Disposition: A | Payer: MEDICAID | Attending: Emergency Medicine | Admitting: Emergency Medicine

## 2024-09-19 ENCOUNTER — Other Ambulatory Visit: Payer: Self-pay

## 2024-09-19 DIAGNOSIS — R1030 Lower abdominal pain, unspecified: Secondary | ICD-10-CM

## 2024-09-19 DIAGNOSIS — N390 Urinary tract infection, site not specified: Secondary | ICD-10-CM

## 2024-09-19 NOTE — ED Triage Notes (Addendum)
 Pt here with c/o extreme lower abdominal cramping rated 10/10 for the past couple of days. Denies any other associated symptoms.

## 2024-09-20 ENCOUNTER — Emergency Department (HOSPITAL_COMMUNITY): Payer: MEDICAID

## 2024-09-20 ENCOUNTER — Encounter (HOSPITAL_COMMUNITY): Payer: Self-pay | Admitting: Emergency Medicine

## 2024-09-20 LAB — CBC WITH DIFFERENTIAL/PLATELET
Abs Immature Granulocytes: 0.02 K/uL (ref 0.00–0.07)
Basophils Absolute: 0.1 K/uL (ref 0.0–0.1)
Basophils Relative: 1 %
Eosinophils Absolute: 0.1 K/uL (ref 0.0–0.5)
Eosinophils Relative: 2 %
HCT: 41.2 % (ref 36.0–46.0)
Hemoglobin: 13.5 g/dL (ref 12.0–15.0)
Immature Granulocytes: 0 %
Lymphocytes Relative: 41 %
Lymphs Abs: 3.6 K/uL (ref 0.7–4.0)
MCH: 29.4 pg (ref 26.0–34.0)
MCHC: 32.8 g/dL (ref 30.0–36.0)
MCV: 89.8 fL (ref 80.0–100.0)
Monocytes Absolute: 0.5 K/uL (ref 0.1–1.0)
Monocytes Relative: 6 %
Neutro Abs: 4.5 K/uL (ref 1.7–7.7)
Neutrophils Relative %: 50 %
Platelets: 280 K/uL (ref 150–400)
RBC: 4.59 MIL/uL (ref 3.87–5.11)
RDW: 13.1 % (ref 11.5–15.5)
WBC: 8.8 K/uL (ref 4.0–10.5)
nRBC: 0 % (ref 0.0–0.2)

## 2024-09-20 LAB — COMPREHENSIVE METABOLIC PANEL WITH GFR
ALT: 33 U/L (ref 0–44)
AST: 29 U/L (ref 15–41)
Albumin: 4.7 g/dL (ref 3.5–5.0)
Alkaline Phosphatase: 100 U/L (ref 38–126)
Anion gap: 11 (ref 5–15)
BUN: 15 mg/dL (ref 6–20)
CO2: 24 mmol/L (ref 22–32)
Calcium: 9 mg/dL (ref 8.9–10.3)
Chloride: 106 mmol/L (ref 98–111)
Creatinine, Ser: 0.73 mg/dL (ref 0.44–1.00)
GFR, Estimated: >60 mL/min (ref >60)
Glucose, Bld: 100 mg/dL — ABNORMAL HIGH (ref 70–99)
Potassium: 3.6 mmol/L (ref 3.5–5.1)
Sodium: 140 mmol/L (ref 135–145)
Total Bilirubin: 0.4 mg/dL (ref 0.0–1.2)
Total Protein: 7.9 g/dL (ref 6.5–8.1)

## 2024-09-20 LAB — URINALYSIS, ROUTINE W REFLEX MICROSCOPIC
Bilirubin Urine: NEGATIVE
Glucose, UA: NEGATIVE mg/dL
Hgb urine dipstick: NEGATIVE
Ketones, ur: NEGATIVE mg/dL
Nitrite: NEGATIVE
Protein, ur: NEGATIVE mg/dL
Specific Gravity, Urine: 1.024 (ref 1.005–1.030)
pH: 7 (ref 5.0–8.0)

## 2024-09-20 LAB — PREGNANCY, URINE: Preg Test, Ur: NEGATIVE

## 2024-09-20 LAB — LIPASE, BLOOD: Lipase: 43 U/L (ref 11–51)

## 2024-09-20 MED ORDER — HYDROCODONE-ACETAMINOPHEN 5-325 MG PO TABS: 1.0000 | ORAL_TABLET | Freq: Four times a day (QID) | ORAL | 0 refills | Status: DC | PRN

## 2024-09-20 MED ORDER — KETOROLAC TROMETHAMINE 30 MG/ML IJ SOLN
30.0000 mg | Freq: Once | INTRAMUSCULAR | Status: AC
  Administered 2024-09-20: 30 mg via INTRAVENOUS
  Filled 2024-09-20: qty 1

## 2024-09-20 MED ORDER — CEPHALEXIN 500 MG PO CAPS: 500.0000 mg | ORAL_CAPSULE | Freq: Three times a day (TID) | ORAL | 0 refills | Status: DC

## 2024-09-20 MED ORDER — IOHEXOL 300 MG/ML  SOLN
100.0000 mL | Freq: Once | INTRAMUSCULAR | Status: AC | PRN
  Administered 2024-09-20: 100 mL via INTRAVENOUS

## 2024-09-20 MED ORDER — MORPHINE SULFATE (PF) 4 MG/ML IV SOLN
4.0000 mg | Freq: Once | INTRAVENOUS | Status: AC
  Administered 2024-09-20: 4 mg via INTRAVENOUS
  Filled 2024-09-20: qty 1

## 2024-09-20 MED ORDER — CEPHALEXIN 500 MG PO CAPS
500.0000 mg | ORAL_CAPSULE | Freq: Once | ORAL | Status: AC
  Administered 2024-09-20: 500 mg via ORAL
  Filled 2024-09-20: qty 1

## 2024-09-20 MED ORDER — SODIUM CHLORIDE 0.9 % IV BOLUS
1000.0000 mL | Freq: Once | INTRAVENOUS | Status: AC
  Administered 2024-09-20: 1000 mL via INTRAVENOUS

## 2024-09-20 MED ORDER — ONDANSETRON HCL 4 MG/2ML IJ SOLN
4.0000 mg | Freq: Once | INTRAMUSCULAR | Status: AC
  Administered 2024-09-20: 4 mg via INTRAVENOUS
  Filled 2024-09-20: qty 2

## 2024-09-20 NOTE — ED Provider Notes (Signed)
 [place] EMERGENCY DEPARTMENT AT [facility] Provider Note   CSN: [id number] Arrival date & time: 09/19/24  2347     Patient presents with: Abdominal Pain   Martha [name] is a 18 y.o. female.   Patient is a 18 year old female with history of GERD, PCOS, irritable bowel.  Patient presenting today with complaints of lower abdominal pain.  She describes pain across her lower abdomen that has been ongoing for the past 2 days.  She initially thought she was having some sort of menstrual cramps, but the pain has become worse.  The pain is constant, but does wax and wane in severity.  She denies any bowel or bladder complaints.  No fevers or chills.  She denies any abnormal bleeding.  Her last menstrual period was the end of November.  She highly doubts the possibility of pregnancy.       Prior to Admission medications   Medication Sig Start Date End Date Taking? Authorizing Provider  albuterol  (VENTOLIN  HFA) 108 (90 Base) MCG/ACT inhaler Inhale 2 puffs into the lungs every 6 (six) hours as needed for wheezing or shortness of breath. 06/25/24   Mauro Elveria BROCKS, NP  fluticasone  (FLOVENT  HFA) 110 MCG/ACT inhaler Inhale 2 puffs into the lungs 2 (two) times daily. Use everyday to prevent wheezing. 05/18/21   Mauro Elveria BROCKS, NP  hydrOXYzine  (VISTARIL ) 25 MG capsule Take one cap po at bedtime prn sleep. 06/25/24   Mauro Elveria BROCKS, NP  NIKKI  3-0.02 MG tablet TAKE ONE TABLET BY MOUTH ONCE DAILY. 04/27/24   Cook, Jayce G, DO  topiramate  (TOPAMAX ) 100 MG tablet Take 1 tablet (100 mg total) by mouth 2 (two) times daily. 05/26/24   Corinthia Blossom, MD    Allergies: Bovine (beef) protein-containing drug products    Review of Systems  All other systems reviewed and are negative.   Updated Vital Signs BP 126/78 (BP Location: Left Arm)   Pulse 88   Temp 98.9 F (37.2 C) (Oral)   Resp 17   Ht 5' 5 (1.651 m)   Wt 108.9 kg   LMP 09/11/2024 (Exact Date)   SpO2 100%   BMI 39.94  kg/m   Physical Exam Vitals and nursing note reviewed.  Constitutional:      General: She is not in acute distress.    Appearance: She is well-developed. She is not diaphoretic.  HENT:     Head: Normocephalic and atraumatic.  Cardiovascular:     Rate and Rhythm: Normal rate and regular rhythm.     Heart sounds: No murmur heard.    No friction rub. No gallop.  Pulmonary:     Effort: Pulmonary effort is normal. No respiratory distress.     Breath sounds: Normal breath sounds. No wheezing.  Abdominal:     General: Bowel sounds are normal. There is no distension.     Palpations: Abdomen is soft.     Tenderness: There is abdominal tenderness in the right lower quadrant, suprapubic area and left lower quadrant. There is no right CVA tenderness, left CVA tenderness, guarding or rebound.  Musculoskeletal:        General: Normal range of motion.     Cervical back: Normal range of motion and neck supple.  Skin:    General: Skin is warm and dry.  Neurological:     General: No focal deficit present.     Mental Status: She is alert and oriented to person, place, and time.     (all labs  ordered are listed, but only abnormal results are displayed) Labs Reviewed - No data to display  EKG: None  Radiology: No results found.   Procedures   Medications Ordered in the ED  sodium chloride  0.9 % bolus 1,000 mL (has no administration in time range)  ondansetron  (ZOFRAN ) injection 4 mg (has no administration in time range)  ketorolac  (TORADOL ) 30 MG/ML injection 30 mg (has no administration in time range)  morphine  (PF) 4 MG/ML injection 4 mg (has no administration in time range)                                    Medical Decision Making Amount and/or Complexity of Data Reviewed Labs: ordered. Radiology: ordered.  Risk Prescription drug management.   Patient presenting here with complaints of lower abdominal pain as described in the HPI.  Patient arrives here with stable vital  signs and is afebrile.  Physical examination reveals tenderness across the lower abdomen, but no peritoneal signs.  Laboratory studies obtained including CBC, CMP, and lipase, all of which are unremarkable.  There is no leukocytosis, no elevation of liver or pancreatic enzymes, and no electrolyte derangement.  Urinalysis is positive for moderate leukocytes, but pregnancy test is negative.  CT scan of the abdomen and pelvis obtained showing no acute intra-abdominal process.  Patient has been hydrated with normal saline and received morphine  and Toradol  for pain and Zofran  for nausea.  She seems to be feeling somewhat better.  The cause of patient's abdominal pain unclear, but nothing appears emergent or surgical.  She does have evidence for UTI, so we will treat with Keflex .  I will also prescribe a small amount of pain medication and have patient follow-up with primary doctor if symptoms persist.     Final diagnoses:  None    ED Discharge Orders     None          Geroldine Berg, MD 09/20/24 [phone]

## 2024-09-20 NOTE — Discharge Instructions (Signed)
 Begin taking Keflex  as prescribed.  Begin taking ibuprofen  600 mg every 6 hours as needed for pain.  Begin taking hydrocodone  as prescribed as needed for pain not relieved with ibuprofen .  Follow-up with primary doctor if symptoms persist, and return to the ER if you develop worsening pain, high fevers, bloody stools, or for other new and concerning symptoms.

## 2024-09-20 NOTE — ED Notes (Signed)
 Patient transported to CT

## 2024-09-24 ENCOUNTER — Encounter: Payer: Self-pay | Admitting: Family Medicine

## 2024-09-24 ENCOUNTER — Ambulatory Visit: Payer: MEDICAID | Admitting: Family Medicine

## 2024-09-24 VITALS — BP 106/66 | HR 73 | Temp 98.4°F | Ht 65.0 in | Wt 247.6 lb

## 2024-09-24 DIAGNOSIS — H00014 Hordeolum externum left upper eyelid: Secondary | ICD-10-CM

## 2024-09-24 NOTE — Progress Notes (Signed)
° °  Acute Office Visit  Subjective:     Patient ID: Martha [name], female    DOB: [birth year], 18 y.o.   MRN: [id number]  Chief Complaint  Patient presents with   Eye Problem    Eye Problem  The left eye is affected. This is a new problem. Episode onset: 4 days. The problem occurs constantly. The problem has been gradually improving. There was no injury mechanism. There is No known exposure to pink eye. She Does not wear contacts. Associated symptoms include an eye discharge (2 days ago, now resolved) and itching. Pertinent negatives include no blurred vision, double vision, eye redness, fever, foreign body sensation, nausea, photophobia, recent URI or vomiting. She has tried nothing for the symptoms.     Review of Systems  Constitutional:  Negative for fever.  Eyes:  Positive for discharge (2 days ago, now resolved) and itching. Negative for blurred vision, double vision, photophobia and redness.  Gastrointestinal:  Negative for nausea and vomiting.        Objective:    BP 106/66   Pulse 73   Temp 98.4 F (36.9 C) (Temporal)   Ht 5' 5 (1.651 m)   Wt 247 lb 9.6 oz (112.3 kg)   LMP 09/11/2024 (Exact Date)   SpO2 100%   BMI 41.20 kg/m    Physical Exam Vitals and nursing note reviewed.  Constitutional:      General: She is not in acute distress.    Appearance: She is obese. She is not ill-appearing, toxic-appearing or diaphoretic.  Eyes:     General: Lids are everted, no foreign bodies appreciated. Vision grossly intact. Gaze aligned appropriately.        Right eye: No foreign body, discharge or hordeolum.        Left eye: Hordeolum (upper with minimal localized erythema. No swelling to eyelid) present.No foreign body or discharge.     Extraocular Movements: Extraocular movements intact.     Conjunctiva/sclera:     Right eye: Right conjunctiva is not injected. No exudate or hemorrhage.    Left eye: Left conjunctiva is not injected. No exudate or hemorrhage.     Comments: No periorbital swelling or erythema present.   Pulmonary:     Effort: Pulmonary effort is normal. No respiratory distress.  Musculoskeletal:     Right lower leg: No edema.     Left lower leg: No edema.  Skin:    General: Skin is warm and dry.  Neurological:     General: No focal deficit present.     Mental Status: She is alert and oriented to person, place, and time.  Psychiatric:        Mood and Affect: Mood normal.     No results found for any visits on 09/24/24.      Assessment & Plan:   Martha [name] was seen today for eye problem.  Diagnoses and all orders for this visit:  Hordeolum externum of left upper eyelid Warm compresses, lid washes discussed. Follow up for worsening symptoms: drainage, increasing swelling, redness, changes in vision. Follow up if symptoms do not improve over the weekend.   Return to office for new or worsening symptoms, or if symptoms persist.   The patient indicates understanding of these issues and agrees with the plan.  Martha [name] Martha [name] Search, FNP

## 2024-09-24 NOTE — Patient Instructions (Signed)
 Stye A stye, also known as a hordeolum, is a bump that forms on an eyelid. It may look like a pimple next to the eyelash. A stye can form inside the eyelid (internal stye) or outside the eyelid (external stye). A stye can cause redness, swelling, and pain on the eyelid. Styes are very common. Anyone can get them at any age. They usually occur in just one eye at a time, but you may have more than one in either eye. What are the causes? A stye is caused by an infection. The infection is almost always caused by bacteria called Staphylococcus aureus. This is a common type of bacteria that lives on the skin. An internal stye may result from an infected oil-producing gland inside the eyelid. An external stye may be caused by an infection at the base of the eyelash (hair follicle). What increases the risk? You are more likely to develop a stye if: You have had a stye before. You have any of these conditions: Red, itchy, inflamed eyelids (blepharitis). A skin condition such as seborrheic dermatitis or rosacea. High fat levels in your blood (lipids). Dry eyes. What are the signs or symptoms? The most common symptom of a stye is eyelid pain. Internal styes are more painful than external styes. Other symptoms may include: Painful swelling of your eyelid. A scratchy feeling in your eye. Tearing and redness of your eye. A pimple-like bump on the edge of the eyelid. Pus draining from the stye. How is this diagnosed? Your health care provider may be able to diagnose a stye just by examining your eye. The health care provider may also check to make sure: You do not have a fever or other signs of a more serious infection. The infection has not spread to other parts of your eye or areas around your eye. How is this treated? Most styes will clear up in a few days without treatment or with warm compresses applied to the area. You may need to use antibiotic drops or ointment to treat an infection. Sometimes,  steroid drops or ointment are used in addition to antibiotics. In some cases, your health care provider may give you a small steroid injection in the eyelid. If your stye does not heal with routine treatment, your health care provider may drain pus from the stye using a thin blade or needle. This may be done if the stye is large, causing a lot of pain, or affecting your vision. Follow these instructions at home: Take over-the-counter and prescription medicines only as told by your health care provider. This includes eye drops or ointments. If you were prescribed an antibiotic medicine, steroid medicine, or both, apply or use them as told by your health care provider. Do not stop using the medicine even if your condition improves. Apply a warm, wet cloth (warm compress) to your eye for 5-10 minutes, 4 to 6 times a day. Clean the affected eyelid as directed by your health care provider. Do not wear contact lenses or eye makeup until your stye has healed and your health care provider says that it is safe. Do not try to pop or drain the stye. Do not rub your eye. Contact a health care provider if: You have chills or a fever. Your stye does not go away after several days. Your stye affects your vision. Your eyeball becomes swollen, red, or painful. Get help right away if: You have pain when moving your eye around. Summary A stye is a bump that forms  on an eyelid. It may look like a pimple next to the eyelash. A stye can form inside the eyelid (internal stye) or outside the eyelid (external stye). A stye can cause redness, swelling, and pain on the eyelid. Your health care provider may be able to diagnose a stye just by examining your eye. Apply a warm, wet cloth (warm compress) to your eye for 5-10 minutes, 4 to 6 times a day. This information is not intended to replace advice given to you by your health care provider. Make sure you discuss any questions you have with your health care  provider. Document Revised: 12/06/2020 Document Reviewed: 12/06/2020 Elsevier Patient Education  2024 [employer].

## 2024-09-27 ENCOUNTER — Ambulatory Visit
Admission: EM | Admit: 2024-09-27 | Discharge: 2024-09-27 | Disposition: A | Discharge status: Home / Self Care | Payer: MEDICAID | Source: Home / Self Care

## 2024-09-27 ENCOUNTER — Ambulatory Visit: Payer: Self-pay

## 2024-09-27 DIAGNOSIS — J069 Acute upper respiratory infection, unspecified: Secondary | ICD-10-CM

## 2024-09-27 LAB — POCT RAPID STREP A (OFFICE): Rapid Strep A Screen: NEGATIVE

## 2024-09-27 LAB — POC COVID19/FLU A&B COMBO
Covid Antigen, POC: NEGATIVE
Influenza A Antigen, POC: NEGATIVE
Influenza B Antigen, POC: NEGATIVE

## 2024-09-27 MED ORDER — LIDOCAINE VISCOUS HCL 2 % MT SOLN: 15.0000 mL | OROMUCOSAL | 0 refills | Status: AC | PRN

## 2024-09-27 MED ORDER — FLUTICASONE PROPIONATE 50 MCG/ACT NA SUSP: 1.0000 | Freq: Every day | NASAL | 0 refills | Status: AC

## 2024-09-27 NOTE — ED Triage Notes (Signed)
 Pt reports sore throat, difficulty swallowing, hoarseness, chills fever fatigued, body aches x 2 days

## 2024-09-27 NOTE — Discharge Instructions (Signed)
 You have a viral upper respiratory infection.  Symptoms should improve over the next week to 10 days.  If you develop chest pain or shortness of breath, go to the emergency room.  COVID-19 and influenza testing is negative today.  Some things that can make you feel better are: - Increased rest - Increasing fluid with water/sugar free electrolytes - Acetaminophen  and ibuprofen  as needed for fever/pain - Salt water gargling, chloraseptic spray and throat lozenges, Flonase , and lidocaine  rinses for sore throat - OTC guaifenesin (Mucinex) 600 mg twice daily for congestion - Saline sinus flushes or a neti pot - Humidifying the air

## 2024-09-27 NOTE — Telephone Encounter (Signed)
 FYI Only or Action Required?: FYI only for provider: UC adised.  Patient was last seen in primary care on 09/24/2024 by Joesph [name] HERO, FNP.  Called Nurse Triage reporting Sore Throat.  Symptoms began 2 days ago.  Interventions attempted: Nothing.  Symptoms are: stable.  Triage Disposition: See Physician Within 24 Hours  Patient/caregiver understands and will follow disposition?: Yes Reason for Disposition  SEVERE throat pain (e.g., excruciating)  Answer Assessment - Initial Assessment Questions Patient reports having difficulty taking deep breaths due to throat pain. No available appointments at PCP office, advised UC   1. ONSET: When did the throat start hurting? (Hours or days ago)      2 days ago  2. SEVERITY: How bad is the sore throat? (Scale 1-10; mild, moderate or severe)     Severe, hard to tark, only able to take short breaths  3. FEVER: Do you have a fever? If Yes, ask: What is your temperature, how was it measured, and when did it start?     Has not checked, but feels warm and reports chills  4. PUS ON THE TONSILS: Is there pus on the tonsils in the back of your throat?     Denies  5. OTHER SYMPTOMS: Do you have any other symptoms? (e.g., difficulty breathing, headache, rash)     Body aches, fatigue  Protocols used: Sore Throat-A-AH  Copied from CRM #[id number]. Topic: Clinical - Red Word Triage >> Sep 27, 2024 10:11 AM Victoria A wrote: [facility] that prompted transfer to Nurse Triage: Patient said for past two days her throat has been closing, having body aches and fever.

## 2024-09-27 NOTE — ED Provider Notes (Signed)
 [facility]    CSN: [id number] Arrival date & time: 09/27/24  1110      History   Chief Complaint No chief complaint on file.   HPI Martha [name] is a 18 y.o. female.   Patient presents today with 3-day history of tactile fever, body aches and chills, runny nose, sore throat, sinus pressure/headache, decreased appetite, and fatigue.  She denies cough, shortness of breath or chest pain, stuffy nose, headache, ear pain, abdominal pain, nausea/vomiting, diarrhea.  No known sick contacts.  Reports she works from home.  She did go to church recently and thinks there may have been sick contacts there.  Has not take anything for symptoms so far.  Patient also concerned about bellybutton infection today.  Denies any active drainage or redness.  Reports that it flares up and she cleans it with hydrogen peroxide, then it gets better.    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 No pertinent surgical history.  OB History   No obstetric history on file.      Home Medications    Prior to  Admission medications  Medication Sig Start Date End Date Taking? Authorizing Provider  fluticasone  (FLONASE ) 50 MCG/ACT nasal spray Place 1 spray into both nostrils daily. 09/27/24  Yes Chandra Raisin A, NP  lidocaine  (XYLOCAINE ) 2 % solution Use as directed 15 mLs in the mouth or throat every 3 (three) hours as needed for mouth pain. Gargle and spit as needed for throat pain 09/27/24  Yes Chandra Raisin A, NP  albuterol  (VENTOLIN  HFA) 108 (90 Base) MCG/ACT inhaler Inhale 2 puffs into the lungs every 6 (six) hours as needed for wheezing or shortness of breath. 06/25/24   Mauro Elveria BROCKS, NP  fluticasone  (FLOVENT  HFA) 110 MCG/ACT inhaler Inhale 2 puffs into the lungs 2 (two) times daily. Use everyday to prevent wheezing. 05/18/21   Mauro Elveria BROCKS, NP  hydrOXYzine  (VISTARIL ) 25 MG capsule Take one cap po at bedtime prn sleep. 06/25/24   Mauro Elveria BROCKS, NP  NIKKI  3-0.02 MG tablet TAKE ONE TABLET BY MOUTH ONCE DAILY. 04/27/24   Cook, Jayce G, DO  topiramate  (TOPAMAX ) 100 MG tablet Take 1 tablet (100 mg total) by mouth 2 (two) times daily. 05/26/24   Corinthia Blossom, MD    Family History Family History  Problem Relation Age of Onset   Diabetes type II Mother    Thyroid  disease Paternal Aunt    Diabetes Maternal Grandmother    Diabetes Paternal Grandfather    Heart disease Other    Stroke Other    Hyperlipidemia Other    Hypertension Other    Cancer Other  Asthma Other    Celiac disease Neg Hx    Crohn's disease Neg Hx    Irritable bowel syndrome Neg Hx     Social History Social History[1]   Allergies   Bovine (beef) protein-containing drug products   Review of Systems Review of Systems Per HPI  Physical Exam Triage Vital Signs ED Triage Vitals  Encounter Vitals Group     BP 09/27/24 1149 99/69     Girls Systolic BP Percentile --      Girls Diastolic BP Percentile --      Boys Systolic BP Percentile --      Boys Diastolic BP Percentile --      Pulse Rate  09/27/24 1149 92     Resp 09/27/24 1149 20     Temp 09/27/24 1149 99.5 F (37.5 C)     Temp Source 09/27/24 1149 Oral     SpO2 09/27/24 1149 98 %     Weight --      Height --      Head Circumference --      Peak Flow --      Pain Score 09/27/24 1152 8     Pain Loc --      Pain Education --      Exclude from Growth Chart --    No data found.  Updated Vital Signs BP 99/69 (BP Location: Right Arm)   Pulse 92   Temp 99.5 F (37.5 C) (Oral)   Resp 20   LMP 09/11/2024 (Exact Date)   SpO2 98%   Visual Acuity Right Eye Distance:   Left Eye Distance:   Bilateral Distance:    Right Eye Near:   Left Eye Near:    Bilateral Near:     Physical Exam Vitals and nursing note reviewed.  Constitutional:      General: She is not in acute distress.    Appearance: Normal appearance. She is not ill-appearing or toxic-appearing.  HENT:     Head: Normocephalic and atraumatic.     Right Ear: Tympanic membrane, ear canal and external ear normal.     Left Ear: Tympanic membrane, ear canal and external ear normal.     Nose: Congestion present. No rhinorrhea.     Mouth/Throat:     Mouth: Mucous membranes are moist.     Pharynx: Oropharynx is clear. Postnasal drip present. No oropharyngeal exudate or posterior oropharyngeal erythema.  Eyes:     General: No scleral icterus.    Extraocular Movements: Extraocular movements intact.  Cardiovascular:     Rate and Rhythm: Normal rate and regular rhythm.  Pulmonary:     Effort: Pulmonary effort is normal. No respiratory distress.     Breath sounds: Normal breath sounds. No wheezing, rhonchi or rales.  Abdominal:     Comments: No redness, swelling, drainage, or odor appreciated about the umbilicus  Musculoskeletal:     Cervical back: Normal range of motion and neck supple.  Lymphadenopathy:     Cervical: No cervical adenopathy.  Skin:    General: Skin is warm and dry.     Coloration: Skin is not jaundiced or pale.     Findings: No erythema  or rash.  Neurological:     Mental Status: She is alert and oriented to person, place, and time.  Psychiatric:        Behavior: Behavior is cooperative.      UC Treatments / Results  Labs (all labs ordered are listed, but only abnormal results are  displayed) Labs Reviewed  POC COVID19/FLU A&B COMBO - Normal  POCT RAPID STREP A (OFFICE) - Normal    EKG   Radiology No results found.  Procedures Procedures (including critical care time)  Medications Ordered in UC Medications - No data to display  Initial Impression / Assessment and Plan / UC Course  I have reviewed the triage vital signs and the nursing notes.  Pertinent labs & imaging results that were available during my care of the patient were reviewed by me and considered in my medical decision making (see chart for details).   Patient is a pleasant, well-appearing 18 year old Hispanic female presenting today for sore throat.  Vital signs are stable and examination is reassuring.  COVID-19, influenza, strep test is negative today.  Supportive care discussed with patient.  Start lidocaine  as needed for throat pain.  ER and return precautions discussed.  Also concern for bellybutton infection-no signs of infection today of umbilicus.  Recommended cleaning the area twice daily with soap and water and avoid peroxide.  The patient was given the opportunity to ask questions.  All questions answered to their satisfaction.  The patient is in agreement to this plan.   Final Clinical Impressions(s) / UC Diagnoses   Final diagnoses:  Viral URI     Discharge Instructions      You have a viral upper respiratory infection.  Symptoms should improve over the next week to 10 days.  If you develop chest pain or shortness of breath, go to the emergency room.  COVID-19 and influenza testing is negative today.  Some things that can make you feel better are: - Increased rest - Increasing fluid with water/sugar free electrolytes -  Acetaminophen  and ibuprofen  as needed for fever/pain - Salt water gargling, chloraseptic spray and throat lozenges, Flonase , and lidocaine  rinses for sore throat - OTC guaifenesin (Mucinex) 600 mg twice daily for congestion - Saline sinus flushes or a neti pot - Humidifying the air     ED Prescriptions     Medication Sig Dispense Auth. Provider   fluticasone  (FLONASE ) 50 MCG/ACT nasal spray Place 1 spray into both nostrils daily. 16 g Chandra Raisin A, NP   lidocaine  (XYLOCAINE ) 2 % solution Use as directed 15 mLs in the mouth or throat every 3 (three) hours as needed for mouth pain. Gargle and spit as needed for throat pain 100 mL Chandra Raisin LABOR, NP      PDMP not reviewed this encounter.     [1]  Social History Tobacco Use   Smoking status: Never    Passive exposure: Yes   Smokeless tobacco: Never   Tobacco comments:    dad smokes outside  Vaping Use   Vaping status: Never Used  Substance Use Topics   Alcohol use: Never   Drug use: Never     Chandra Raisin LABOR, NP 09/27/24 1242

## 2024-09-28 DIAGNOSIS — R300 Dysuria: Secondary | ICD-10-CM | POA: Diagnosis present

## 2024-09-28 DIAGNOSIS — E039 Hypothyroidism, unspecified: Secondary | ICD-10-CM | POA: Insufficient documentation

## 2024-09-28 DIAGNOSIS — B3731 Acute candidiasis of vulva and vagina: Secondary | ICD-10-CM | POA: Diagnosis not present

## 2024-09-28 DIAGNOSIS — J45909 Unspecified asthma, uncomplicated: Secondary | ICD-10-CM | POA: Diagnosis not present

## 2024-09-28 DIAGNOSIS — Z7951 Long term (current) use of inhaled steroids: Secondary | ICD-10-CM | POA: Diagnosis not present

## 2024-09-29 ENCOUNTER — Other Ambulatory Visit: Payer: Self-pay

## 2024-09-29 ENCOUNTER — Encounter (HOSPITAL_COMMUNITY): Payer: Self-pay

## 2024-09-29 ENCOUNTER — Emergency Department (HOSPITAL_COMMUNITY)
Admission: EM | Admit: 2024-09-29 | Discharge: 2024-09-29 | Disposition: A | Payer: MEDICAID | Attending: Emergency Medicine | Admitting: Emergency Medicine

## 2024-09-29 DIAGNOSIS — B3731 Acute candidiasis of vulva and vagina: Secondary | ICD-10-CM

## 2024-09-29 LAB — URINALYSIS, ROUTINE W REFLEX MICROSCOPIC
Bilirubin Urine: NEGATIVE
Glucose, UA: NEGATIVE mg/dL
Ketones, ur: NEGATIVE mg/dL
Nitrite: NEGATIVE
Protein, ur: 30 mg/dL — AB
Specific Gravity, Urine: 1.026 (ref 1.005–1.030)
WBC, UA: >50 WBC/hpf (ref 0–5)
pH: 5 (ref 5.0–8.0)

## 2024-09-29 LAB — COMPREHENSIVE METABOLIC PANEL WITH GFR
ALT: 25 U/L (ref 0–44)
AST: 15 U/L (ref 15–41)
Albumin: 4.5 g/dL (ref 3.5–5.0)
Alkaline Phosphatase: 100 U/L (ref 38–126)
Anion gap: 15 (ref 5–15)
BUN: 9 mg/dL (ref 6–20)
CO2: 18 mmol/L — ABNORMAL LOW (ref 22–32)
Calcium: 8.6 mg/dL — ABNORMAL LOW (ref 8.9–10.3)
Chloride: 105 mmol/L (ref 98–111)
Creatinine, Ser: 0.72 mg/dL (ref 0.44–1.00)
GFR, Estimated: >60 mL/min (ref >60)
Glucose, Bld: 107 mg/dL — ABNORMAL HIGH (ref 70–99)
Potassium: 3.6 mmol/L (ref 3.5–5.1)
Sodium: 138 mmol/L (ref 135–145)
Total Bilirubin: 0.4 mg/dL (ref 0.0–1.2)
Total Protein: 7.6 g/dL (ref 6.5–8.1)

## 2024-09-29 LAB — CBC
HCT: 38.9 % (ref 36.0–46.0)
Hemoglobin: 13 g/dL (ref 12.0–15.0)
MCH: 30 pg (ref 26.0–34.0)
MCHC: 33.4 g/dL (ref 30.0–36.0)
MCV: 89.8 fL (ref 80.0–100.0)
Platelets: 235 K/uL (ref 150–400)
RBC: 4.33 MIL/uL (ref 3.87–5.11)
RDW: 13.1 % (ref 11.5–15.5)
WBC: 8.4 K/uL (ref 4.0–10.5)
nRBC: 0 % (ref 0.0–0.2)

## 2024-09-29 LAB — LIPASE, BLOOD: Lipase: 31 U/L (ref 11–51)

## 2024-09-29 LAB — WET PREP, GENITAL
Clue Cells Wet Prep HPF POC: NONE SEEN
Sperm: NONE SEEN
Trich, Wet Prep: NONE SEEN
WBC, Wet Prep HPF POC: <10 (ref <10)

## 2024-09-29 LAB — POC URINE PREG, ED: Preg Test, Ur: NEGATIVE

## 2024-09-29 MED ORDER — FLUCONAZOLE 150 MG PO TABS: 150.0000 mg | ORAL_TABLET | Freq: Once | ORAL | 0 refills | Status: AC

## 2024-09-29 MED ORDER — ACETAMINOPHEN 325 MG PO TABS
650.0000 mg | ORAL_TABLET | Freq: Once | ORAL | Status: AC
  Administered 2024-09-29: 03:00:00 650 mg via ORAL
  Filled 2024-09-29: qty 2

## 2024-09-29 MED ORDER — FLUCONAZOLE 100 MG PO TABS
100.0000 mg | ORAL_TABLET | Freq: Once | ORAL | Status: AC
  Administered 2024-09-29: 04:00:00 100 mg via ORAL
  Filled 2024-09-29: qty 1

## 2024-09-29 NOTE — ED Provider Notes (Signed)
 [place] EMERGENCY DEPARTMENT AT [facility] Provider Note   CSN: [id number] Arrival date & time: 09/28/24  2358     Patient presents with: Abdominal Pain   Martha [name] is a 18 y.o. female.   The history is provided by the patient.  Patient history of asthma presents with dysuria.  Patient reports over the past several hours she has had dysuria, frequency and burning.  She also reports vaginal discharge and redness.  No vaginal bleeding.  She denies any previous sexual activity.  No flank pain. No fevers or vomiting.  She is just recently getting over an upper respiratory infection. She was treated for a UTI last week and completed therapy   Past Medical History:  Diagnosis Date   Asthma    Hypothyroid 09/17/2021    Prior to Admission medications  Medication Sig Start Date End Date Taking? Authorizing Provider  fluconazole  (DIFLUCAN ) 150 MG tablet Take 1 tablet (150 mg total) by mouth once for 1 dose. 09/29/24 09/29/24 Yes Midge Golas, MD  albuterol  (VENTOLIN  HFA) 108 [phone] Base) MCG/ACT inhaler Inhale 2 puffs into the lungs every 6 (six) hours as needed for wheezing or shortness of breath. 06/25/24   Hoskins, Carolyn C, NP  fluticasone  (FLONASE ) 50 MCG/ACT nasal spray Place 1 spray into both nostrils daily. 09/27/24   Chandra Harlene LABOR, NP  fluticasone  (FLOVENT  HFA) 110 MCG/ACT inhaler Inhale 2 puffs into the lungs 2 (two) times daily. Use everyday to prevent wheezing. 05/18/21   Hoskins, Carolyn C, NP  hydrOXYzine  (VISTARIL ) 25 MG capsule Take one cap po at bedtime prn sleep. 06/25/24   Mauro Elveria BROCKS, NP  lidocaine  (XYLOCAINE ) 2 % solution Use as directed 15 mLs in the mouth or throat every 3 (three) hours as needed for mouth pain. Gargle and spit as needed for throat pain 09/27/24   Chandra Harlene A, NP  NIKKI  3-0.02 MG tablet TAKE ONE TABLET BY MOUTH ONCE DAILY. 04/27/24   Cook, Jayce G, DO  topiramate  (TOPAMAX ) 100 MG tablet Take 1 tablet (100 mg  total) by mouth 2 (two) times daily. 05/26/24   Corinthia Blossom, MD    Allergies: Bovine (beef) protein-containing drug products    Review of Systems  Constitutional:  Negative for fever.  HENT:  Positive for congestion.   Genitourinary:  Positive for dysuria.    Updated Vital Signs BP 127/83   Pulse (!) 113   Temp 98 F (36.7 C) (Oral)   Resp 19   Ht 1.651 m (5' 5)   Wt 112.3 kg   LMP 09/11/2024 (Exact Date)   SpO2 98%   BMI 41.20 kg/m   Physical Exam CONSTITUTIONAL: Well developed/well nourished, no distress HEAD: Normocephalic/atraumatic ENMT: Mucous membranes moist NECK: supple no meningeal signs CV: S1/S2 noted, no murmurs/rubs/gallops noted LUNGS: Lungs are clear to auscultation bilaterally, no apparent distress ABDOMEN: soft, nontender, no rebound or guarding, bowel sounds noted throughout abdomen GU:no cva tenderness Whitish discharge noted.  Mild overlying erythema.  No abscess, no signs of any herpetic lesions Chaperone present for exam NEURO: Pt is awake/alert/appropriate, moves all extremitiesx4.  No facial droop.   EXTREMITIES: pulses normal/equal, full ROM  (all labs ordered are listed, but only abnormal results are displayed) Labs Reviewed  WET PREP, GENITAL - Abnormal; Notable for the following components:      Result Value   Yeast Wet Prep HPF POC PRESENT (*)    All other components within normal limits  COMPREHENSIVE METABOLIC PANEL WITH GFR -  Abnormal; Notable for the following components:   CO2 18 (*)    Glucose, Bld 107 (*)    Calcium 8.6 (*)    All other components within normal limits  URINALYSIS, ROUTINE W REFLEX MICROSCOPIC - Abnormal; Notable for the following components:   Color, Urine AMBER (*)    APPearance CLOUDY (*)    Hgb urine dipstick SMALL (*)    Protein, ur 30 (*)    Leukocytes,Ua LARGE (*)    Bacteria, UA RARE (*)    All other components within normal limits  URINE CULTURE  LIPASE, BLOOD  CBC  POC URINE PREG, ED   GC/CHLAMYDIA PROBE AMP ([place]) NOT AT [facility]    EKG: None  Radiology: No results found.   Procedures   Medications Ordered in the ED  acetaminophen  (TYLENOL ) tablet 650 mg (650 mg Oral Given 09/29/24 0311)  fluconazole  (DIFLUCAN ) tablet 100 mg (100 mg Oral Given 09/29/24 0420)                                    Medical Decision Making Amount and/or Complexity of Data Reviewed Labs: ordered.  Risk OTC drugs. Prescription drug management.   Patient presents with dysuria and discharge.  She is not sexually active.  Patient found to have a yeast infection.  Likely related to recent use of antibiotics.  One-time dose of Diflucan  given, and will also give prescription. Urine culture has been sent but will defer additional antibiotics at this time Patient denies any other pain complaints and request discharge     Final diagnoses:  Vulvovaginitis due to yeast    ED Discharge Orders          Ordered    fluconazole  (DIFLUCAN ) 150 MG tablet   Once        09/29/24 0407               Midge Golas, MD 09/29/24 [phone]

## 2024-09-29 NOTE — ED Triage Notes (Signed)
 Pt to ED from home with c/o burning to vaginal area and abd pain, pt says she just finished anbx for UTI

## 2024-09-30 LAB — URINE CULTURE: Culture: <10000 — AB

## 2024-09-30 LAB — GC/CHLAMYDIA PROBE AMP (~~LOC~~) NOT AT ARMC
Chlamydia: NEGATIVE
Comment: NEGATIVE
Comment: NORMAL
Neisseria Gonorrhea: NEGATIVE

## 2024-12-08 ENCOUNTER — Ambulatory Visit (INDEPENDENT_AMBULATORY_CARE_PROVIDER_SITE_OTHER): Payer: MEDICAID | Admitting: Neurology
